# Patient Record
Sex: Male | Born: 1995 | Race: White | Hispanic: No | Marital: Single | Smoking: Current some day smoker
Health system: Southern US, Community
[De-identification: ages and names within clinical notes are randomized; demographics above are authoritative.]

---
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
## 2017-05-27 ENCOUNTER — Other Ambulatory Visit: Payer: Self-pay

## 2017-05-27 ENCOUNTER — Ambulatory Visit
Admission: EM | Admit: 2017-05-27 | Discharge: 2017-05-27 | Disposition: A | Discharge status: Home / Self Care | Payer: 59 | Attending: Emergency Medicine | Admitting: Emergency Medicine

## 2017-05-27 DIAGNOSIS — J029 Acute pharyngitis, unspecified: Secondary | ICD-10-CM | POA: Diagnosis not present

## 2017-05-27 DIAGNOSIS — M791 Myalgia, unspecified site: Secondary | ICD-10-CM | POA: Diagnosis not present

## 2017-05-27 DIAGNOSIS — H60502 Unspecified acute noninfective otitis externa, left ear: Secondary | ICD-10-CM

## 2017-05-27 DIAGNOSIS — J111 Influenza due to unidentified influenza virus with other respiratory manifestations: Secondary | ICD-10-CM

## 2017-05-27 DIAGNOSIS — R509 Fever, unspecified: Secondary | ICD-10-CM | POA: Diagnosis not present

## 2017-05-27 DIAGNOSIS — R0981 Nasal congestion: Secondary | ICD-10-CM

## 2017-05-27 LAB — RAPID INFLUENZA A&B ANTIGENS: Influenza A (ARMC): NEGATIVE

## 2017-05-27 LAB — RAPID INFLUENZA A&B ANTIGENS (ARMC ONLY): INFLUENZA B (ARMC): NEGATIVE

## 2017-05-27 MED ORDER — ACETAMINOPHEN 500 MG PO TABS
1000.0000 mg | ORAL_TABLET | Freq: Once | ORAL | Status: AC
  Administered 2017-05-27: 1000 mg via ORAL

## 2017-05-27 MED ORDER — NEOMYCIN-POLYMYXIN-HC 3.5-10000-1 OT SUSP: 4.0000 [drp] | Freq: Four times a day (QID) | OTIC | 0 refills | Status: DC

## 2017-05-27 MED ORDER — FLUTICASONE PROPIONATE 50 MCG/ACT NA SUSP: 2.0000 | Freq: Every day | NASAL | 0 refills | Status: DC

## 2017-05-27 MED ORDER — IBUPROFEN 600 MG PO TABS: 600.0000 mg | ORAL_TABLET | Freq: Four times a day (QID) | ORAL | 0 refills | Status: DC | PRN

## 2017-05-27 MED ORDER — HYDROCOD POLST-CPM POLST ER 10-8 MG/5ML PO SUER: 5.0000 mL | Freq: Two times a day (BID) | ORAL | 0 refills | Status: DC | PRN

## 2017-05-27 MED ORDER — OSELTAMIVIR PHOSPHATE 75 MG PO CAPS: 75.0000 mg | ORAL_CAPSULE | Freq: Two times a day (BID) | ORAL | 0 refills | Status: DC

## 2017-05-27 NOTE — Discharge Instructions (Signed)
[facility] [address]  [name].  Take the medication as written. Start Mucinex-D to keep the mucous thin and to decongest you.  Push electrolyte containing fluids.  You may take 600 mg of motrin with 1 gram of tylenol up to 3-4 times a day as needed for pain. This is an effective combination for pain.  Use a NeilMed sinus rinse as often as you want to to reduce nasal congestion. Follow the directions on the box.   Go to www.goodrx.com to look up your medications. This will give you a list of where you can find your prescriptions at the most affordable prices. Or you can ask the pharmacist what the cash price is. This is frequently cheaper than going through insurance.

## 2017-05-27 NOTE — ED Provider Notes (Signed)
HPI  SUBJECTIVE:  Bobby Duncan is a 22 y.o. male who presents with acute onset of fevers T-max 102, chills, body aches, headaches, nasal congestion, rhinorrhea, postnasal drip, mild sore throat starting yesterday.  He states that his left ear occasionally hurts.  He tried ibuprofen 400 mg with improvement of symptoms.  He also tried NyQuil.  No aggravating factors.  No sinus pain or pressure.  No coughing, wheezing, chest pain, shortness of breath.  No abdominal pain.  No vomiting, diarrhea, urinary complaints.  No photophobia, neck stiffness, rash.  No antibiotics in the past month.  No antipyretic in the past 6-8 hours.  Did get a flu shot this year.  No contacts with the flu.  He has a past medical history of asthma.  No history of diabetes, hypertension, HIV, immunocompromise.  PMD: DR. Jaclyn Prime in [place]    History reviewed. No pertinent past medical history.  History reviewed. No pertinent surgical history.  History reviewed. No pertinent family history.  Social History   Tobacco Use  . Smoking status: Never Smoker  . Smokeless tobacco: Never Used  Substance Use Topics  . Alcohol use: No    Frequency: Never  . Drug use: No    No current facility-administered medications for this encounter.   Current Outpatient Medications:  .  cetirizine (ZYRTEC) 10 MG tablet, Take 10 mg by mouth daily., Disp: , Rfl:  .  chlorpheniramine-HYDROcodone (TUSSIONEX PENNKINETIC ER) 10-8 MG/5ML SUER, Take 5 mLs by mouth every 12 (twelve) hours as needed for cough., Disp: 120 mL, Rfl: 0 .  fluticasone (FLONASE) 50 MCG/ACT nasal spray, Place 2 sprays into both nostrils daily., Disp: 16 g, Rfl: 0 .  ibuprofen (ADVIL,MOTRIN) 600 MG tablet, Take 1 tablet (600 mg total) by mouth every 6 (six) hours as needed., Disp: 30 tablet, Rfl: 0 .  neomycin-polymyxin-hydrocortisone (CORTISPORIN) 3.5-10000-1 OTIC suspension, Place 4 drops into the left ear 4 (four) times daily. X 7 days, Disp: 7.5 mL, Rfl: 0 .   oseltamivir (TAMIFLU) 75 MG capsule, Take 1 capsule (75 mg total) by mouth 2 (two) times daily. X 5 days, Disp: 10 capsule, Rfl: 0  Allergies  Allergen Reactions  . Penicillins Rash     ROS  As noted in HPI.   Physical Exam  BP (!) 121/56 (BP Location: Left Arm)   Pulse 83   Temp 100.1 F (37.8 C) (Oral)   Ht  (1.803 m)   Wt 164 lb (74.4 kg)   SpO2 100%   BMI 22.87 kg/m   Constitutional: Well developed, well nourished, no acute distress Eyes: PERRL, EOMI, conjunctiva normal bilaterally HENT: Normocephalic, atraumatic,mucus membranes moist.  Left external ear canal erythematous, tender, but not swollen.  No pain with traction on the pinna or palpation of tragus.  Left TM normal.  Right TM normal.  Minimal nasal congestion.  Erythematous, swollen turbinates.  No sinus tenderness.  Positive postnasal drip.  Oropharynx normal.  Uvula midline. Neck: No meningismus.  Positive shotty cervical lymphadenopathy Respiratory: Clear to auscultation bilaterally, no rales, no wheezing, no rhonchi Cardiovascular: Normal rate and rhythm, no murmurs, no gallops, no rubs GI: Soft, nondistended, normal bowel sounds, nontender, no rebound, no guarding Back: no CVAT skin: No rash, skin intact Musculoskeletal: No edema, no tenderness, no deformities Neurologic: Alert & oriented x 3, CN II-XII grossly intact, no motor deficits, sensation grossly intact Psychiatric: Speech and behavior appropriate   ED Course   Medications  acetaminophen (TYLENOL) tablet 1,000 mg (1,000 mg Oral Given  05/27/17 1547)    Orders Placed This Encounter  Procedures  . Rapid Influenza A&B Antigens ([facility] only)    Standing Status:   Standing    Number of Occurrences:   1   Results for orders placed or performed during the hospital encounter of 05/27/17 (from the past 24 hour(s))  Rapid Influenza A&B Antigens ([facility] only)     Status: None   Collection Time: 05/27/17  3:43 PM  Result Value Ref Range   Influenza  A ([facility]) NEGATIVE NEGATIVE   Influenza B ([facility]) NEGATIVE NEGATIVE   No results found.  ED Clinical Impression  Influenza  Acute otitis externa of left ear, unspecified type   ED Assessment/Plan   [place] Narcotic database reviewed for this patient, and feel that the risk/benefit ratio today is favorable for proceeding with a prescription for controlled substance.  No opiate prescriptions in 2 years.  Presentation consistent with influenza.  Even though the rapid flu is negative, will send home with Tamiflu, ibuprofen 600 mg to take with 1 g of Tylenol 3-4 times a day, Flonase, Tussionex to help him sleep at night since he states that he is not sleeping well at night.  Also wait-and-see prescription of cortisporin eardrops  for possible otitis externa since he does have pain in his external ear canal on exam and it is erythematous.  Follow up With his PMD as needed, to the ER if he gets worse.  Patient agrees with plan.  Meds ordered this encounter  Medications  . acetaminophen (TYLENOL) tablet 1,000 mg  . fluticasone (FLONASE) 50 MCG/ACT nasal spray    Sig: Place 2 sprays into both nostrils daily.    Dispense:  16 g    Refill:  0  . ibuprofen (ADVIL,MOTRIN) 600 MG tablet    Sig: Take 1 tablet (600 mg total) by mouth every 6 (six) hours as needed.    Dispense:  30 tablet    Refill:  0  . chlorpheniramine-HYDROcodone (TUSSIONEX PENNKINETIC ER) 10-8 MG/5ML SUER    Sig: Take 5 mLs by mouth every 12 (twelve) hours as needed for cough.    Dispense:  120 mL    Refill:  0  . oseltamivir (TAMIFLU) 75 MG capsule    Sig: Take 1 capsule (75 mg total) by mouth 2 (two) times daily. X 5 days    Dispense:  10 capsule    Refill:  0  . neomycin-polymyxin-hydrocortisone (CORTISPORIN) 3.5-10000-1 OTIC suspension    Sig: Place 4 drops into the left ear 4 (four) times daily. X 7 days    Dispense:  7.5 mL    Refill:  0    *This clinic note was created using Scientist, clinical (histocompatibility and immunogenetics). Therefore, there  may be occasional mistakes despite careful proofreading.  ?   Domenick Gong, MD 05/28/17 1304

## 2017-05-27 NOTE — ED Triage Notes (Signed)
Patient is c/o congestion, headache and chills since last night. Patient was diagnosed with a concussion 2 weeks ago.

## 2020-12-19 ENCOUNTER — Encounter: Payer: Self-pay | Admitting: Emergency Medicine

## 2020-12-19 ENCOUNTER — Other Ambulatory Visit: Payer: Self-pay

## 2020-12-19 ENCOUNTER — Emergency Department (INDEPENDENT_AMBULATORY_CARE_PROVIDER_SITE_OTHER)
Admission: EM | Admit: 2020-12-19 | Discharge: 2020-12-19 | Disposition: A | Discharge status: Home / Self Care | Payer: Managed Care, Other (non HMO) | Source: Home / Self Care | Attending: Family Medicine | Admitting: Family Medicine

## 2020-12-19 DIAGNOSIS — S0031XA Abrasion of nose, initial encounter: Secondary | ICD-10-CM | POA: Diagnosis not present

## 2020-12-19 MED ORDER — MUPIROCIN 2 % EX OINT: 1.0000 "application " | TOPICAL_OINTMENT | Freq: Two times a day (BID) | CUTANEOUS | 0 refills | Status: AC

## 2020-12-19 NOTE — ED Provider Notes (Signed)
[facility]    CSN: [id number] Arrival date & time: 12/19/20  1130      History   Chief Complaint Chief Complaint  Patient presents with   nose pain     right    HPI Bobby Duncan is a 25 y.o. male.   HPI Patient has pain in right nostril.  Is been present for 2 weeks.  He states it feels like there is a cut inside his nose.  He works around Web designer.  He uses a salve and states that the fiberglass dust is irritating.  He does not wear any respiratory or eye protection.  He is advised that he should  Past Medical History:  Diagnosis Date   COVID-19 05/2018   02/2020 Covid 2nd time - no vaccine    There are no problems to display for this patient.   History reviewed. No pertinent surgical history.     Home Medications    Prior to Admission medications   Medication Sig Start Date End Date Taking? Authorizing Provider  mupirocin ointment (BACTROBAN) 2 % Apply 1 application topically 2 (two) times daily. 12/19/20  Yes Eustace Moore, MD  cetirizine (ZYRTEC) 10 MG tablet Take 10 mg by mouth daily.    [provider]    Family History Family History  Problem Relation Age of Onset   Healthy Mother    Healthy Father     Social History Social History   Tobacco Use   Smoking status: Some Days    Types: E-cigarettes   Smokeless tobacco: Never  Substance Use Topics   Alcohol use: No   Drug use: No     Allergies   Penicillins   Review of Systems Review of Systems  See HPI Physical Exam Triage Vital Signs ED Triage Vitals  Enc Vitals Group     BP 12/19/20 1144 123/78     Pulse Rate 12/19/20 1144 (!) 58     Resp 12/19/20 1144 15     Temp 12/19/20 1144 99.1 F (37.3 C)     Temp Source 12/19/20 1144 Oral     SpO2 12/19/20 1144 99 %     Weight 12/19/20 1145 170 lb (77.1 kg)     Height 12/19/20 1145 5\' 11"  (1.803 m)     Head Circumference --      Peak Flow --      Pain Score 12/19/20 1145 4     Pain Loc --      Pain  Edu? --      Excl. in GC? --    No data found.  Updated Vital Signs BP 123/78 (BP Location: Right Arm)   Pulse (!) 58   Temp 99.1 F (37.3 C) (Oral)   Resp 15   Ht 5\' 11"  (1.803 m)   Wt 77.1 kg   SpO2 99%   BMI 23.71 kg/m         Physical Exam Constitutional:      General: He is not in acute distress.    Appearance: He is well-developed.  HENT:     Head: Normocephalic and atraumatic.     Right Ear: Tympanic membrane, ear canal and external ear normal.     Left Ear: Tympanic membrane, ear canal and external ear normal.     Nose:     Comments: Nares are examined with speculum.  No foreign body seen.  The septum on the right side is erythematous with some yellow crusting visible.    Mouth/Throat:  Mouth: Mucous membranes are moist.     Pharynx: No posterior oropharyngeal erythema.  Eyes:     Conjunctiva/sclera: Conjunctivae normal.     Pupils: Pupils are equal, round, and reactive to light.  Cardiovascular:     Rate and Rhythm: Normal rate and regular rhythm.     Heart sounds: Normal heart sounds.  Pulmonary:     Effort: Pulmonary effort is normal. No respiratory distress.     Breath sounds: No wheezing.  Abdominal:     General: There is no distension.     Palpations: Abdomen is soft.  Musculoskeletal:        General: Normal range of motion.     Cervical back: Normal range of motion.  Lymphadenopathy:     Cervical: No cervical adenopathy.  Skin:    General: Skin is warm and dry.  Neurological:     Mental Status: He is alert.     UC Treatments / Results  Labs (all labs ordered are listed, but only abnormal results are displayed) Labs Reviewed - No data to display  EKG   Radiology No results found.  Procedures Procedures (including critical care time)  Medications Ordered in UC Medications - No data to display  Initial Impression / Assessment and Plan / UC Course  I have reviewed the triage vital signs and the nursing notes.  Pertinent labs &  imaging results that were available during my care of the patient were reviewed by me and considered in my medical decision making (see chart for details).     Discussed saline for moisture.  Mupirocin ointment.  Return as needed Final Clinical Impressions(s) / UC Diagnoses   Final diagnoses:  Abrasion of nose, initial encounter     Discharge Instructions      Use nasal ointment twice a day until symptoms improve   ED Prescriptions     Medication Sig Dispense Auth. Provider   mupirocin ointment (BACTROBAN) 2 % Apply 1 application topically 2 (two) times daily. 22 g Eustace Moore, MD      PDMP not reviewed this encounter.   Eustace Moore, MD 12/19/20 [phone]

## 2020-12-19 NOTE — Discharge Instructions (Addendum)
Use nasal ointment twice a day until symptoms improve

## 2020-12-19 NOTE — ED Triage Notes (Signed)
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
